# Patient Record
Sex: Female | Born: 1985 | Hispanic: Yes | Marital: Single | Smoking: Never smoker
Health system: Southern US, Community
[De-identification: ages and names within clinical notes are randomized; demographics above are authoritative.]

---

## 2007-06-09 ENCOUNTER — Ambulatory Visit: Payer: Self-pay | Admitting: Family Medicine

## 2007-07-09 ENCOUNTER — Ambulatory Visit: Payer: Self-pay | Admitting: Family Medicine

## 2007-10-25 ENCOUNTER — Inpatient Hospital Stay: Payer: Self-pay | Admitting: Certified Nurse Midwife

## 2007-10-25 ENCOUNTER — Observation Stay: Payer: Self-pay | Admitting: Obstetrics and Gynecology

## 2012-08-07 ENCOUNTER — Ambulatory Visit: Payer: Self-pay | Admitting: Family Medicine

## 2012-12-25 ENCOUNTER — Inpatient Hospital Stay: Payer: Self-pay | Admitting: Obstetrics and Gynecology

## 2012-12-25 LAB — CBC WITH DIFFERENTIAL/PLATELET
Basophil %: 0.4 %
Eosinophil #: 0 10*3/uL (ref 0.0–0.7)
Eosinophil %: 0.1 %
HCT: 35.4 % (ref 35.0–47.0)
Lymphocyte #: 1.6 10*3/uL (ref 1.0–3.6)
Lymphocyte %: 9 %
MCH: 32.6 pg (ref 26.0–34.0)
Monocyte #: 0.8 x10 3/mm (ref 0.2–0.9)
Monocyte %: 4.3 %
Neutrophil #: 15.7 10*3/uL — ABNORMAL HIGH (ref 1.4–6.5)
Platelet: 228 10*3/uL (ref 150–440)
RBC: 3.83 10*6/uL (ref 3.80–5.20)

## 2012-12-26 LAB — HEMATOCRIT: HCT: 32.3 % — ABNORMAL LOW (ref 35.0–47.0)

## 2014-07-20 NOTE — H&P (Signed)
L&D Evaluation:  History:  HPI 29 y/o [id number]@ 39wks EDC 12/30/12 arrives with regular contractions, denies leaking fluid or vaginal bleedin.g Baby is active. [facility], well pregnancy. GBS negative.   Presents with contractions   Patient's Medical History No Chronic Illness   Patient's Surgical History none   Medications Pre Natal Vitamins   Allergies NKDA   Social History none   Family History Non-Contributory   ROS:  ROS All systems were reviewed.  HEENT, CNS, GI, GU, Respiratory, CV, Renal and Musculoskeletal systems were found to be normal.   Exam:  Vital Signs stable   Urine Protein not completed   General no apparent distress   Mental Status clear   Chest clear   Heart normal sinus rhythm   Abdomen gravid, non-tender   Estimated Fetal Weight Average for gestational age   Fetal Position vtx   Fundal Height term   Back no CVAT   Edema no edema   Reflexes 1+   Clonus negative   Pelvic no external lesions, 8-9cm BBOW vtx @ 0 station nl show AROM lite MSAF   Mebranes Ruptured   Description green/meconium   FHT normal rate with no decels, baseline 130's avg variability with accels   Fetal Heart Rate 136   Ucx regular, q 2 mins 60 sec strong   Skin dry   Lymph no lymphadenopathy   Impression:  Impression active labor   Plan:  Plan monitor contractions and for cervical change   Comments Admitted, knows what to expect with 2nd baby. Stadol has helped. Interpreter present.   Electronic Signatures: Acquanetta BellingLugiano, Wilmetta Speiser B (CNM)  (Signed 16-Oct-14 22:06)  Authored: L&D Evaluation   Last Updated: 16-Oct-14 22:06 by Albertina ParrLugiano, Katreena Schupp B (CNM)

## 2018-12-11 ENCOUNTER — Other Ambulatory Visit: Payer: Self-pay

## 2018-12-11 DIAGNOSIS — Z20822 Contact with and (suspected) exposure to covid-19: Secondary | ICD-10-CM

## 2018-12-12 LAB — NOVEL CORONAVIRUS, NAA: SARS-CoV-2, NAA: NOT DETECTED

## 2018-12-15 ENCOUNTER — Telehealth: Payer: Self-pay | Admitting: General Practice

## 2018-12-15 NOTE — Telephone Encounter (Signed)
Patient informed of negative covid-19 result. Patient verbalized understanding.  °

## 2021-03-16 DIAGNOSIS — Z13220 Encounter for screening for lipoid disorders: Secondary | ICD-10-CM | POA: Diagnosis not present

## 2021-03-16 DIAGNOSIS — Z3009 Encounter for other general counseling and advice on contraception: Secondary | ICD-10-CM | POA: Diagnosis not present

## 2021-03-16 DIAGNOSIS — N83209 Unspecified ovarian cyst, unspecified side: Secondary | ICD-10-CM | POA: Diagnosis not present

## 2021-03-16 DIAGNOSIS — Z1389 Encounter for screening for other disorder: Secondary | ICD-10-CM | POA: Diagnosis not present

## 2021-03-16 DIAGNOSIS — Z131 Encounter for screening for diabetes mellitus: Secondary | ICD-10-CM | POA: Diagnosis not present

## 2021-03-16 DIAGNOSIS — Z1159 Encounter for screening for other viral diseases: Secondary | ICD-10-CM | POA: Diagnosis not present

## 2021-03-16 DIAGNOSIS — Z0000 Encounter for general adult medical examination without abnormal findings: Secondary | ICD-10-CM | POA: Diagnosis not present

## 2021-03-16 DIAGNOSIS — B351 Tinea unguium: Secondary | ICD-10-CM | POA: Diagnosis not present

## 2021-07-08 ENCOUNTER — Emergency Department
Admission: EM | Admit: 2021-07-08 | Discharge: 2021-07-08 | Disposition: A | Discharge status: Home / Self Care | Payer: 59 | Attending: Emergency Medicine | Admitting: Emergency Medicine

## 2021-07-08 ENCOUNTER — Other Ambulatory Visit: Payer: Self-pay

## 2021-07-08 ENCOUNTER — Emergency Department: Payer: 59

## 2021-07-08 DIAGNOSIS — D72829 Elevated white blood cell count, unspecified: Secondary | ICD-10-CM | POA: Insufficient documentation

## 2021-07-08 DIAGNOSIS — X500XXA Overexertion from strenuous movement or load, initial encounter: Secondary | ICD-10-CM | POA: Diagnosis not present

## 2021-07-08 DIAGNOSIS — M545 Low back pain, unspecified: Secondary | ICD-10-CM | POA: Insufficient documentation

## 2021-07-08 DIAGNOSIS — R102 Pelvic and perineal pain: Secondary | ICD-10-CM | POA: Insufficient documentation

## 2021-07-08 LAB — URINALYSIS, ROUTINE W REFLEX MICROSCOPIC
Bilirubin Urine: NEGATIVE
Glucose, UA: NEGATIVE mg/dL
Hgb urine dipstick: NEGATIVE
Ketones, ur: NEGATIVE mg/dL
Nitrite: NEGATIVE
Protein, ur: NEGATIVE mg/dL
Specific Gravity, Urine: 1.005 (ref 1.005–1.030)
pH: 7 (ref 5.0–8.0)

## 2021-07-08 LAB — POC URINE PREG, ED: Preg Test, Ur: NEGATIVE

## 2021-07-08 MED ORDER — HYDROCODONE-ACETAMINOPHEN 5-325 MG PO TABS: 1.0000 | ORAL_TABLET | Freq: Three times a day (TID) | ORAL | 0 refills | Status: DC | PRN

## 2021-07-08 MED ORDER — KETOROLAC TROMETHAMINE 30 MG/ML IJ SOLN
30.0000 mg | Freq: Once | INTRAMUSCULAR | Status: AC
  Administered 2021-07-08: 30 mg via INTRAMUSCULAR
  Filled 2021-07-08: qty 1

## 2021-07-08 MED ORDER — CYCLOBENZAPRINE HCL 5 MG PO TABS
5.0000 mg | ORAL_TABLET | Freq: Three times a day (TID) | ORAL | 0 refills | Status: DC | PRN
Start: 2021-07-08 — End: 2021-07-08

## 2021-07-08 MED ORDER — CYCLOBENZAPRINE HCL 5 MG PO TABS: 5.0000 mg | ORAL_TABLET | Freq: Three times a day (TID) | ORAL | 0 refills | Status: AC | PRN

## 2021-07-08 MED ORDER — HYDROCODONE-ACETAMINOPHEN 5-325 MG PO TABS: 1.0000 | ORAL_TABLET | Freq: Three times a day (TID) | ORAL | 0 refills | Status: AC | PRN

## 2021-07-08 MED ORDER — HYDROCODONE-ACETAMINOPHEN 5-325 MG PO TABS
1.0000 | ORAL_TABLET | Freq: Once | ORAL | Status: AC
  Administered 2021-07-08: 1 via ORAL
  Filled 2021-07-08: qty 1

## 2021-07-08 MED ORDER — CYCLOBENZAPRINE HCL 10 MG PO TABS
5.0000 mg | ORAL_TABLET | Freq: Once | ORAL | Status: AC
  Administered 2021-07-08: 5 mg via ORAL
  Filled 2021-07-08: qty 1

## 2021-07-08 NOTE — ED Notes (Signed)
See triage note  presents with lower back pain  states she lifted a a clothes basket and felt the pain  no relief with tylenol  unable ot bear wt w/o increased pain ?

## 2021-07-08 NOTE — ED Triage Notes (Signed)
Pt having lower back pain since yesterday- pt states she lifted a heavy basket and then the pain started ?

## 2021-07-08 NOTE — Discharge Instructions (Signed)
You were seen today for low back pain.  Your urine did not show any evidence of infection.  You are not pregnant.  The x-ray of your lumbar does not show any acute findings.  I have sent in a prescription for pain medication to take every 8 hours as needed for severe pain.  Please be aware that this may cause sedation.  I have also sent in prescription for muscle relaxers to take every 8 hours as needed for muscle tightness.  This may also cause sedation.  You may also take ibuprofen 600 mg every 8 hours as needed for pain and inflammation.  Alternate heat and ice.  Stretching and massage may also be helpful.  Follow-up with your PCP if symptoms persist. ?

## 2021-07-08 NOTE — ED Provider Notes (Signed)
? ?[facility] ?Provider Note ? ? ? Event Date/Time  ? First MD Initiated Contact with Patient 07/08/21 1204   ?  (approximate) ? ? ?History  ? ?Back Pain ? ? ?HPI ? ?Vanessa Hicks is a 36 y.o. female  presents to the ER today with c/o low back pain. She reports this started yesterday. She describes the pain as pressure. The pain radiates into her lower abdomen. The pain does not radiate down her legs. She does have some weakness in her lower extremities but denies numbness or tingling.  She denies nausea, vomiting, reflux, constipation, diarrhea, urinary urgency, urinary frequency, blood in her urine, vaginal discharge, vaginal irritation or abnormal vaginal bleeding.  She reports she does have a history of ovarian cyst but this feels different.  She reports this pain started after lifting a heavy basket yesterday.  She has never had any back problems prior.  She has not taken anything OTC for this. ? ? ?Physical Exam  ? ?Triage Vital Signs: ?ED Triage Vitals  ?Enc Vitals Group  ?   BP 07/08/21 1205 138/81  ?   Pulse Rate 07/08/21 1205 74  ?   Resp 07/08/21 1205 18  ?   Temp 07/08/21 1205 98.3 ?F (36.8 ?C)  ?   Temp Source 07/08/21 1205 Oral  ?   SpO2 07/08/21 1205 100 %  ?   Weight 07/08/21 1204 182 lb (82.6 kg)  ?   Height 07/08/21 1204 5\' 5"  (1.651 m)  ?   Head Circumference --   ?   Peak Flow --   ?   Pain Score 07/08/21 1204 10  ?   Pain Loc --   ?   Pain Edu? --   ?   Excl. in [place]? --   ? ? ?Most recent vital signs: ?Vitals:  ? 07/08/21 1205  ?BP: 138/81  ?Pulse: 74  ?Resp: 18  ?Temp: 98.3 ?F (36.8 ?C)  ?SpO2: 100%  ? ? ? ?General: Awake, appears in pain. ?CV:  RRR.  Pedal pulses 2+ bilaterally. ?Resp:  Lungs CTA bilaterally. ?Abd:  Active bowel sounds.  She reports pain in her lower back with palpation of the bilateral lower abdomen. ?MSK:  When she bears weight, she is holding onto her husband for support.  She reports that she is unable to bend forward, backward or twist from  side to side secondary to the pain.  No bony tenderness noted over the lumbar spine.  Pain with palpation of the right paralumbar muscle.  She is refusing to walk secondary to the pain, requesting wheelchair.  She declines to lift her legs up off the bed against resistance secondary to pain.  Normal resistance with plantarflexion/dorsiflexion of bilateral feet. ? ? ?ED Results / Procedures / Treatments  ? ?Labs ? ?Labs Reviewed  ?URINALYSIS, ROUTINE W REFLEX MICROSCOPIC - Abnormal; Notable for the following components:  ?    Result Value  ? Color, Urine STRAW (*)   ? APPearance CLEAR (*)   ? Leukocytes,Ua MODERATE (*)   ? Bacteria, UA RARE (*)   ? All other components within normal limits  ?POC URINE PREG, ED  ? ? ? ?RADIOLOGY ?Imaging Orders    ?     DG Lumbar Spine 2-3 Views    ? ?IMPRESSION: Negative.  ? ?MEDICATIONS ORDERED IN ED: ?Medications  ?ketorolac (TORADOL) 30 MG/ML injection 30 mg (30 mg Intramuscular Given 07/08/21 1237)  ?cyclobenzaprine (FLEXERIL) tablet 5 mg (5 mg Oral Given  07/08/21 1240)  ?HYDROcodone-acetaminophen (NORCO/VICODIN) 5-325 MG per tablet 1 tablet (1 tablet Oral Given 07/08/21 1423)  ? ? ? ?IMPRESSION / MDM / ASSESSMENT AND PLAN / ED COURSE  ?I reviewed the triage vital signs and the nursing notes. ? ?Acute Low Back Pain, Pelvic Pain: ? ?Differential diagnosis includes, but is not limited to, lumbar strain, muscle spasms of the low back, acute right-sided low back pain without sciatica, UTI, acute pyelonephritis, kidney stone ? ?Toradol 30 mg IM x1 ?Flexeril 5 mg PO x1 ?Urinalysis shows moderate leukocytes, rare bacteria, negative blood or nitrites which is not consistent with infection ?Urine hCG negative for pregnancy ?X-ray lumbar spine shows normal alignment, no acute findings per my read, confirmed by radiology ?Upon reassessment, she is now able to perform range of motion slowly and with pain.  She is able to stand without assistance.  She reports her pain has improved but very  slightly. ?Hydrocodone 5-325 mg PO x1, with significant improvement in symptoms ?RX for Hydrocodone 5-325 mg Q8H prn- sedation and addiction caution given ?RX for Flexeril 5 mg Q8H prn- sedation caution given ?Encouraged stretching, alternating heat and ice and massage ? ? ? ?FINAL CLINICAL IMPRESSION(S) / ED DIAGNOSES  ? ?Final diagnoses:  ?Acute right-sided low back pain without sciatica  ? ? ? ?Rx / DC Orders  ? ?ED Discharge Orders   ? ?      Ordered  ?  HYDROcodone-acetaminophen (NORCO/VICODIN) 5-325 MG tablet  Every 8 hours PRN       ? 07/08/21 1504  ?  cyclobenzaprine (FLEXERIL) 5 MG tablet  3 times daily PRN       ? 07/08/21 1504  ? ?  ?  ? ?  ? ? ? ?Note:  This document was prepared using Dragon voice recognition software and may include unintentional dictation errors. ? ?  ?Jearld Fenton, NP ?07/08/21 1507 ? ?  ?Naaman Plummer, MD ?07/08/21 1642 ? ?

## 2021-07-10 DIAGNOSIS — M545 Low back pain, unspecified: Secondary | ICD-10-CM | POA: Diagnosis not present

## 2021-07-10 DIAGNOSIS — Z0000 Encounter for general adult medical examination without abnormal findings: Secondary | ICD-10-CM | POA: Diagnosis not present

## 2021-07-10 DIAGNOSIS — Z1389 Encounter for screening for other disorder: Secondary | ICD-10-CM | POA: Diagnosis not present

## 2022-08-30 DIAGNOSIS — N83209 Unspecified ovarian cyst, unspecified side: Secondary | ICD-10-CM | POA: Diagnosis not present

## 2022-08-30 DIAGNOSIS — Z131 Encounter for screening for diabetes mellitus: Secondary | ICD-10-CM | POA: Diagnosis not present

## 2022-08-30 DIAGNOSIS — F320 Major depressive disorder, single episode, mild: Secondary | ICD-10-CM | POA: Diagnosis not present

## 2022-08-30 DIAGNOSIS — Z1331 Encounter for screening for depression: Secondary | ICD-10-CM | POA: Diagnosis not present

## 2022-08-30 DIAGNOSIS — Z13220 Encounter for screening for lipoid disorders: Secondary | ICD-10-CM | POA: Diagnosis not present

## 2022-08-30 DIAGNOSIS — Z124 Encounter for screening for malignant neoplasm of cervix: Secondary | ICD-10-CM | POA: Diagnosis not present

## 2022-08-30 DIAGNOSIS — Z1389 Encounter for screening for other disorder: Secondary | ICD-10-CM | POA: Diagnosis not present

## 2022-08-30 DIAGNOSIS — Z0000 Encounter for general adult medical examination without abnormal findings: Secondary | ICD-10-CM | POA: Diagnosis not present

## 2022-08-31 DIAGNOSIS — Z124 Encounter for screening for malignant neoplasm of cervix: Secondary | ICD-10-CM | POA: Diagnosis not present

## 2022-11-07 DIAGNOSIS — N83201 Unspecified ovarian cyst, right side: Secondary | ICD-10-CM | POA: Diagnosis not present

## 2022-11-07 DIAGNOSIS — D252 Subserosal leiomyoma of uterus: Secondary | ICD-10-CM | POA: Diagnosis not present

## 2022-11-07 DIAGNOSIS — D251 Intramural leiomyoma of uterus: Secondary | ICD-10-CM | POA: Diagnosis not present

## 2022-11-21 DIAGNOSIS — M2550 Pain in unspecified joint: Secondary | ICD-10-CM | POA: Diagnosis not present

## 2022-11-21 DIAGNOSIS — Z1389 Encounter for screening for other disorder: Secondary | ICD-10-CM | POA: Diagnosis not present

## 2022-11-21 DIAGNOSIS — Z3009 Encounter for other general counseling and advice on contraception: Secondary | ICD-10-CM | POA: Diagnosis not present

## 2022-12-13 IMAGING — CR DG LUMBAR SPINE 2-3V
3 series · 3 of 3 positions shown · non-contrast
Comparison: None.

CLINICAL DATA: Low back pain after lifting close basket.

EXAM:
LUMBAR SPINE - 2-3 VIEW

[l-spine ap]
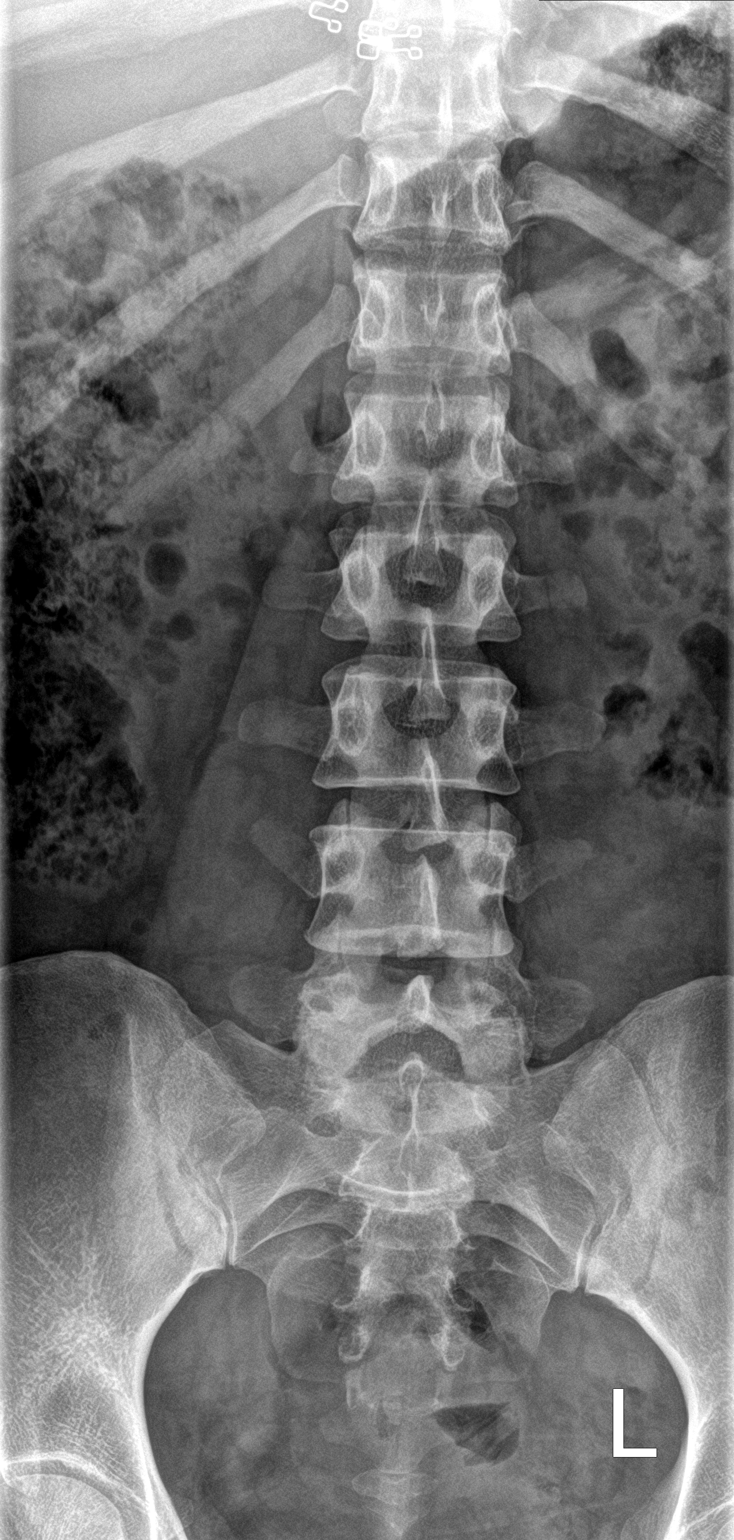

[l-spine lat]
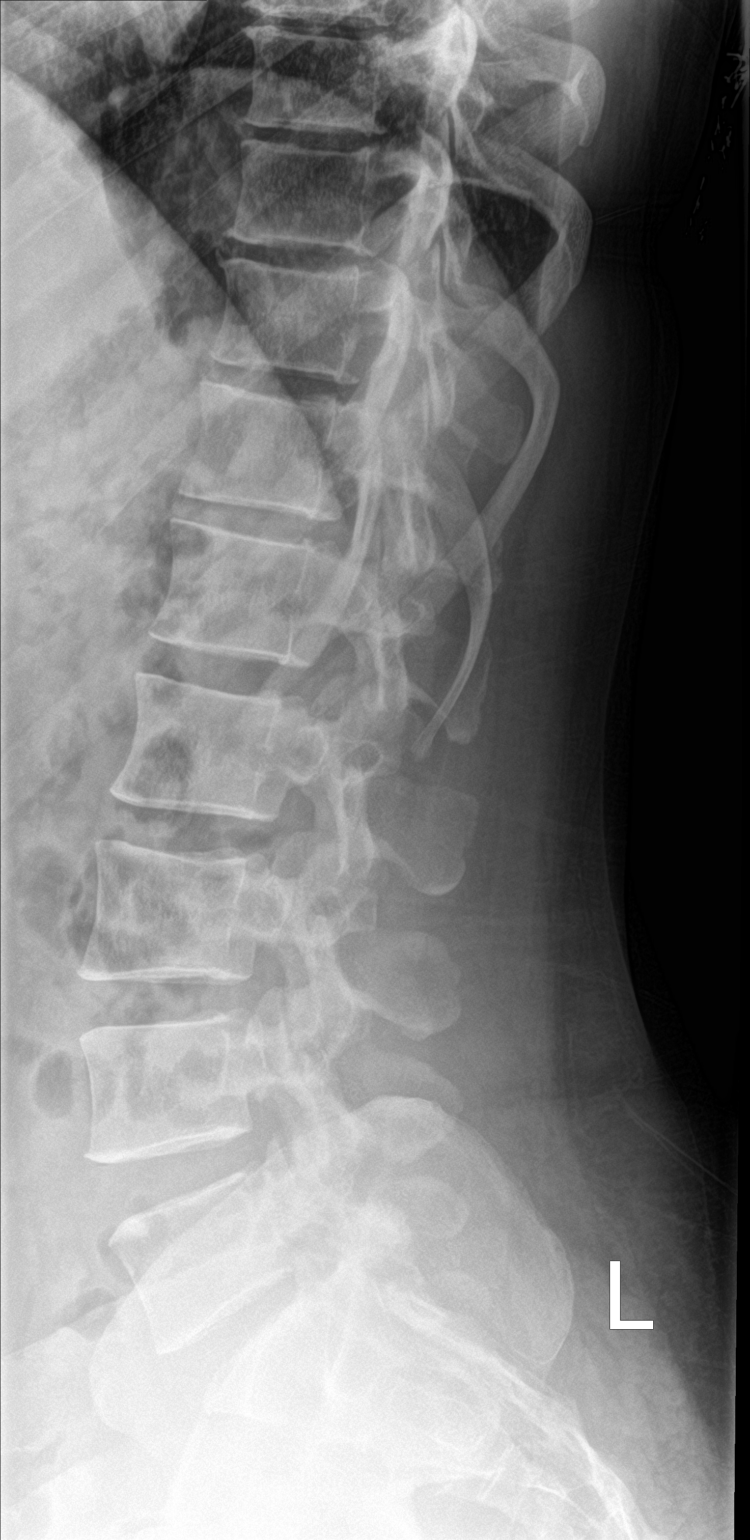

[l-spine spot]
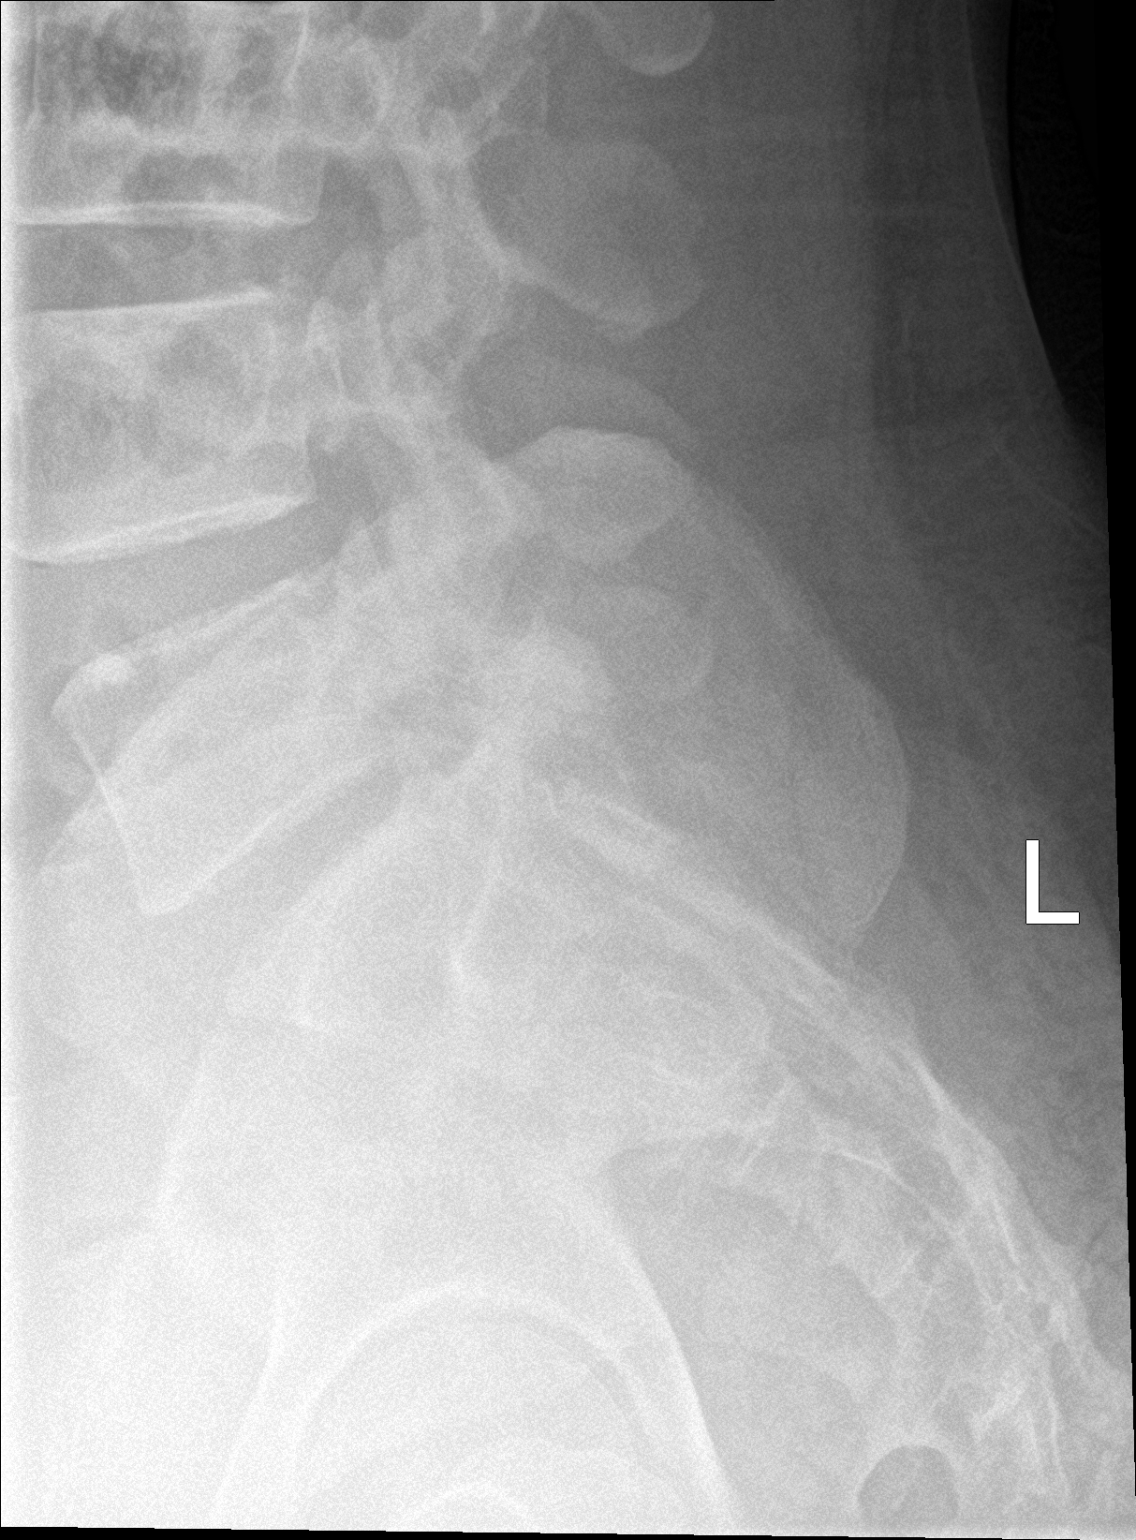

[3 of 3 positions shown; findings below may reference images not displayed]

FINDINGS: There is no evidence of lumbar spine fracture. Alignment is normal.
Intervertebral disc spaces are maintained.
IMPRESSION: Negative.

## 2023-03-20 DIAGNOSIS — N83209 Unspecified ovarian cyst, unspecified side: Secondary | ICD-10-CM | POA: Diagnosis not present

## 2023-03-20 DIAGNOSIS — N939 Abnormal uterine and vaginal bleeding, unspecified: Secondary | ICD-10-CM | POA: Diagnosis not present

## 2023-03-20 DIAGNOSIS — N941 Unspecified dyspareunia: Secondary | ICD-10-CM | POA: Diagnosis not present
